# Patient Record
Sex: Female | Born: 1990 | Race: White | Hispanic: No | Marital: Single | Smoking: Current some day smoker
Health system: Southern US, Community
[De-identification: ages and names within clinical notes are randomized; demographics above are authoritative.]

## PROBLEM LIST (undated history)

## (undated) DIAGNOSIS — F419 Anxiety disorder, unspecified: Secondary | ICD-10-CM

## (undated) HISTORY — DX: Anxiety disorder, unspecified: F41.9

## (undated) HISTORY — PX: NO PAST SURGERIES: SHX2092

## (undated) HISTORY — PX: MASTECTOMY, RADICAL: SHX710

---

## 2016-05-16 ENCOUNTER — Encounter: Payer: Self-pay | Admitting: Neurology

## 2016-05-16 ENCOUNTER — Ambulatory Visit (INDEPENDENT_AMBULATORY_CARE_PROVIDER_SITE_OTHER): Payer: Managed Care, Other (non HMO) | Admitting: Neurology

## 2016-05-16 VITALS — BP 106/66 | HR 69 | Ht 62.0 in | Wt 137.2 lb

## 2016-05-16 DIAGNOSIS — G40201 Localization-related (focal) (partial) symptomatic epilepsy and epileptic syndromes with complex partial seizures, not intractable, with status epilepticus: Secondary | ICD-10-CM

## 2016-05-16 DIAGNOSIS — G40209 Localization-related (focal) (partial) symptomatic epilepsy and epileptic syndromes with complex partial seizures, not intractable, without status epilepticus: Secondary | ICD-10-CM

## 2016-05-16 NOTE — Patient Instructions (Addendum)
Remember to drink plenty of fluid, eat healthy meals and do not skip any meals. Try to eat protein with a every meal and eat a healthy snack such as fruit or nuts in between meals. Try to keep a regular sleep-wake schedule and try to exercise daily, particularly in the form of walking, 20-30 minutes a day, if you can.   As far as diagnostic testing: MRI brain, routine EEG in the office and then extended eeg at home  You are unable to drive, operate heavy machinery, perform activities at heights or participate in water activities until 6 months seizure free   Our phone number is [phone]. We also have an after hours call service for urgent matters and there is a physician on-call for urgent questions. For any emergencies you know to call 911 or go to the nearest emergency room   Seizure, Adult A seizure is a sudden burst of abnormal electrical activity in the brain. The abnormal activity temporarily interrupts normal brain function, causing a person to experience any of the following:  Involuntary movements.  Changes in awareness or consciousness.  Uncontrollable shaking (convulsions). Seizures usually last from 30 seconds to 2 minutes. They usually do not cause permanent brain damage unless they are prolonged. What can cause a seizure to happen? Seizures can happen for many reasons including:  A fever.  Low blood sugar.  A medicine.  An illnesses.  A brain injury. Some people who have a seizure never have another one. People who have repeated seizures have a condition called epilepsy. What are the symptoms of a seizure? Symptoms of a seizure vary greatly from person to person. They include:  Convulsions.  Stiffening of the body.  Involuntary movements of the arms or legs.  Loss of consciousness.  Breathing problems.  Falling suddenly.  Confusion.  Head nodding.  Eye blinking or fluttering.  Lip smacking.  Drooling.  Rapid eye movements.  Grunting.  Loss  of bladder control and bowel control.  Staring.  Unresponsiveness. Some people have symptoms right before a seizure happens (aura) and right after a seizure happens. Symptoms of an aura include:  Fear or anxiety.  Nausea.  Feeling like the room is spinning (vertigo).  A feeling of having seen or heard something before (deja vu).  Odd tastes or smells.  Changes in vision, such as seeing flashing lights or spots. Symptoms that may follow a seizure include:  Confusion.  Sleepiness.  Headache.  Weakness of one side of the body. Follow these instructions at home: Medicines  Take over-the-counter and prescription medicines only as told by your health care provider.  Avoid any substances that may prevent your medicine from working properly, such as alcohol. Activity  Do not drive, swim, or do any other activities that would be dangerous if you had another seizure. Wait until your health care provider approves.  If you live in the [place] check with your local DMV (department of motor vehicles) to find out about the local driving laws. Each state has specific rules about when you can legally return to driving.  Get enough rest. Lack of sleep can make seizures more likely to occur. Educating others Teach friends and family what to do if you have a seizure. They should:  Lay you on the ground to prevent a fall.  Cushion your head and body.  Loosen any tight clothing around your neck.  Turn you on your side. If vomiting occurs, this helps keep your airway clear.  Stay with you until  you recover.  Not hold you down. Holding you down will not stop the seizure.  Not put anything in your mouth.  Know whether or not you need emergency care. General instructions  Contact your health care provider each time you have a seizure.  Avoid anything that has ever triggered a seizure for you.  Keep a seizure diary. Record what you remember about each seizure, especially anything  that might have triggered the seizure.  Keep all follow-up visits as told by your health care provider. This is important. Contact a health care provider if:  You have another seizure.  You have seizures more often.  Your seizure symptoms change.  You continue to have seizures with treatment.  You have symptoms of an infection or illness. They might increase your risk of having a seizure. Get help right away if:  You have a seizure:  That lasts longer than 5 minutes.  That is different than previous seizures.  That leaves you unable to speak or use a part of your body.  That makes it harder to breathe.  After a head injury.  You have:  Multiple seizures in a row.  Confusion or a severe headache right after a seizure.  You are having seizures more often.  You do not wake up immediately after a seizure.  You injure yourself during a seizure. These symptoms may represent a serious problem that is an emergency. Do not wait to see if the symptoms will go away. Get medical help right away. Call your local emergency services (911 in the [place] Do not drive yourself to the hospital.  This information is not intended to replace advice given to you by your health care provider. Make sure you discuss any questions you have with your health care provider. Document Released: 04/26/2000 Document Revised: 12/24/2015 Document Reviewed: 12/01/2015 Elsevier Interactive Patient Education  2017 [employer].

## 2016-05-16 NOTE — Progress Notes (Signed)
[facility]    Provider:  Dr Lucia Gaskins Referring Provider: Loren Racer, MD Primary Care Physician:  Loren Racer, MD  CC:  Seizures  HPI:  Lisa Turner is a 26 y.o. female here as a referral from Dr. Graciella Belton for seizures. Patient identifies as Lisa Turner. Past medical history of anxiety. On December 25th at 230am his sisters sais they sounded like he was snoring, legs started spasming, they ran and the parents sat him up and he was not waking up, he was performing non-purposeful actions such as chewing on mother's sleeve. It lasted for 15-20 minutes and the next thing he remembers was being at the front door to put his shoes on to go to the ED. His mentation improved in the car but he was still slightly confused, couldn;t count to 10 but this cleared by the time they were at the ED. He was taking his roommate's Aderrall. Otherwise no new medications or previous illnesses or head trauma. He was taking smaller doses of testosterone and recently increased. No FHx of seizures. No history of seizures.  It was Christmas time and he was stressed and maybe working on less sleep but this is the only triggering factor he can think of. No other current focal neurologic deficits, no other associated symptoms, modifiable factors or inciting events.  Reviewed notes, labs and imaging from outside physicians, which showed:   Review notes. Patient presented to the emergency room on December 25 with seizure-like activity. The father's stepdad observed him to be laying on his side "stiff and shaking". The episode occurred around 2:30 AM. Stepfather observed "sonorous-type respirations" following the episode. Patient does not remember this event. Following the convulsions the patient was somnolent for a period of approximately 10-20 minutes followed by 10-20 minute episode of confusion and disorientation. The patient was attempting to eat his mother's letter with reaching at things that were  not there. While traveling to the emergency room his mentation improved. Patient reported a slight headache at the time of the emergency room arrival, stated it was resolved. Did not bite tongue or urination on himself. Family reported observing flatus during the episode. Vitals were normal in the emergency room, exam including neurologic exam was normal, CBC was abnormal due to elevated white blood cells 12.9, chemistry was unremarkable except for glucose 108. BUN was 12, creatinine 0.82 on 05/06/2016. EKG showed sinus rhythm at a rate of 85, no ST elevations or depressions. CT of the head was negative. CPK was slightly elevated. Toxicology screen positive for amphetamine. Patient takes his roommates Adderall at times. Patient was advised not to drive.   Review of Systems: Patient complains of symptoms per HPI as well as the following symptoms: no CP, no SOB. Pertinent negatives per HPI. All others negative.   Social History   Social History  . Marital status: Single    Spouse name: N/A  . Number of children: 0  . Years of education: BA, MA in progress   Occupational History  . [employer]    Social History Main Topics  . Smoking status: Former Games developer  . Smokeless tobacco: Never Used  . Alcohol use Yes     Comment: Few beers per week  . Drug use:     Types: Marijuana  . Sexual activity: Not on file   Other Topics Concern  . Not on file   Social History Narrative   Lives w/ roommate   Caffeine use: 300-500mg /day    Family History  Problem Relation Age of Onset  . Seizures Neg Hx     Past Medical History:  Diagnosis Date  . Anxiety     Past Surgical History:  Procedure Laterality Date  . NO PAST SURGERIES      Current Outpatient Prescriptions  Medication Sig Dispense Refill  . amphetamine-dextroamphetamine (ADDERALL) 10 MG tablet Take 10 mg by mouth daily with breakfast. 5-10 mg every other day    . FLUOXETINE HCL PO Take 30 mg by mouth daily.    Marland Kitchen testosterone  enanthate (DELATESTRYL) 200 MG/ML injection Inject 60 mg into the muscle every 7 (seven) weeks. For IM use only     No current facility-administered medications for this visit.     Allergies as of 05/16/2016 - Review Complete 05/16/2016  Allergen Reaction Noted  . Bee venom  05/16/2016  . Lactose intolerance (gi)  05/16/2016    Vitals: BP 106/66 (BP Location: Right Arm, Patient Position: Sitting, Cuff Size: Normal)   Pulse 69   Ht 5\' 2"  (1.575 m)   Wt 137 lb 3.2 oz (62.2 kg)   BMI 25.09 kg/m  Last Weight:  Wt Readings from Last 1 Encounters:  05/16/16 137 lb 3.2 oz (62.2 kg)   Last Height:   Ht Readings from Last 1 Encounters:  05/16/16 5\' 2"  (1.575 m)   Physical exam: Exam: Gen: NAD, conversant, well nourised, well groomed                     CV: RRR, no MRG. No Carotid Bruits. No peripheral edema, warm, nontender Eyes: Conjunctivae clear without exudates or hemorrhage  Neuro: Detailed Neurologic Exam  Speech:    Speech is normal; fluent and spontaneous with normal comprehension.  Cognition:    The patient is oriented to person, place, and time;     recent and remote memory intact;     language fluent;     normal attention, concentration,     fund of knowledge Cranial Nerves:    The pupils are equal, round, and reactive to light. The fundi are normal and spontaneous venous pulsations are present. Visual fields are full to finger confrontation. Extraocular movements are intact. Trigeminal sensation is intact and the muscles of mastication are normal. The face is symmetric. The palate elevates in the midline. Hearing intact. Voice is normal. Shoulder shrug is normal. The tongue has normal motion without fasciculations.   Coordination:    Normal finger to nose and heel to shin. Normal rapid alternating movements.   Gait:    Heel-toe and tandem gait are normal.   Motor Observation:    No asymmetry, no atrophy, and no involuntary movements noted. Tone:    Normal  muscle tone.    Posture:    Posture is normal. normal erect    Strength:    Strength is V/V in the upper and lower limbs.      Sensation: intact to LT     Reflex Exam:  DTR's:    Deep tendon reflexes in the upper and lower extremities are normal bilaterally.   Toes:    The toes are downgoing bilaterally.   Clonus:    Clonus is absent.       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  MRI brain w/wo contrast to evaluate for any intracranial focus of seizure such as mass or lesion Routine EEG, if negative consider prolonged ambulatory eeg Discussed that we will not start anti-epilepsy medications at this  time. If the patient has a second seizure he is to call me immediately to initiate treatment. Call 911 for any other seizure activity and also call this office. Patient is unable to drive, operate heavy machinery, perform activities at heights or participate in water activities until 6 months seizure free. Discussed with patient, he acknowledges these restrictions until 6 months seizure free in the state of [place]. Discussed seizure precautions.  Cc: Loren RacerYager, Cornell K, MD  Naomie DeanAntonia Beverley Sherrard, MD  [facility] [address] [place], [place] [id number]  Phone [phone] Fax [phone]

## 2016-06-19 ENCOUNTER — Ambulatory Visit (INDEPENDENT_AMBULATORY_CARE_PROVIDER_SITE_OTHER): Payer: Managed Care, Other (non HMO) | Admitting: Neurology

## 2016-06-19 DIAGNOSIS — G40209 Localization-related (focal) (partial) symptomatic epilepsy and epileptic syndromes with complex partial seizures, not intractable, without status epilepticus: Secondary | ICD-10-CM

## 2016-06-19 DIAGNOSIS — G40201 Localization-related (focal) (partial) symptomatic epilepsy and epileptic syndromes with complex partial seizures, not intractable, with status epilepticus: Secondary | ICD-10-CM | POA: Diagnosis not present

## 2016-06-19 NOTE — Procedures (Signed)
    History:  Lisa Turner is a 26 year old gentleman with a history of anxiety and possible seizures. The patient had an episode of leg spasms, the family was unable to arouse him completely. The episode lasted 15-20 minutes. This occurred on 05/06/2016, coming out of sleep. The patient is being evaluated for possible seizures.  This is a routine EEG. No skull defects are noted. Medications include Adderall, Prozac, and testosterone.   EEG classification: Normal awake  Description of the recording: The background rhythms of this recording consists of a fairly well modulated medium amplitude alpha rhythm of 10 Hz that is reactive to eye opening and closure. As the record progresses, the patient appears to remain in the waking state throughout the recording. Photic stimulation was performed, resulting in a bilateral and symmetric photic driving response. Hyperventilation was also performed, resulting in a minimal buildup of the background rhythm activities without significant slowing seen. At no time during the recording does there appear to be evidence of spike or spike wave discharges or evidence of focal slowing. EKG monitor shows no evidence of cardiac rhythm abnormalities with a heart rate of 60.  Impression: This is a normal EEG recording in the waking state. No evidence of ictal or interictal discharges are seen.

## 2016-06-20 ENCOUNTER — Telehealth: Payer: Self-pay

## 2016-06-20 NOTE — Telephone Encounter (Signed)
-----   Message from Anson FretAntonia B Ahern, MD sent at 06/19/2016  8:44 PM EST ----- EEG was normal. We discussed 3-day ambulatory EEG would you inquire if she is still willing to do this? If so, please fill out a neurovative diagnostics form for me please. Thank you!

## 2016-06-20 NOTE — Telephone Encounter (Signed)
Called w/ normal EEG results. Verbalized understanding and appreciation for call. Is interested in doing 3-day ambulatory EEG but asks if this could wait until after graduation in May.

## 2016-06-23 NOTE — Telephone Encounter (Signed)
I would ask patient to call us when he is ready to proceed and we can fill out the form and get it set up thanks

## 2016-06-24 NOTE — Telephone Encounter (Signed)
Called pt and left VM mssg asking pt to call back when ready to schedule procedure.

## 2016-09-04 ENCOUNTER — Ambulatory Visit: Payer: Managed Care, Other (non HMO) | Admitting: Adult Health

## 2016-09-04 ENCOUNTER — Telehealth: Payer: Self-pay

## 2016-09-04 NOTE — Telephone Encounter (Signed)
Patient did not show to appt today  

## 2016-09-05 ENCOUNTER — Encounter: Payer: Self-pay | Admitting: Adult Health

## 2017-08-06 ENCOUNTER — Encounter: Payer: Self-pay | Admitting: Emergency Medicine

## 2017-08-06 ENCOUNTER — Ambulatory Visit: Payer: Self-pay | Admitting: Emergency Medicine

## 2017-08-06 VITALS — BP 110/72 | HR 90 | Temp 98.5°F | Wt 137.6 lb

## 2017-08-06 DIAGNOSIS — Z760 Encounter for issue of repeat prescription: Secondary | ICD-10-CM

## 2017-08-06 MED ORDER — FLUOXETINE HCL 20 MG PO CAPS: 20.0000 mg | ORAL_CAPSULE | Freq: Every day | ORAL | 1 refills | Status: AC

## 2017-08-06 NOTE — Progress Notes (Signed)
S: Lisa Turner  Is a 27 y.o. female ,transitioning to female, presenting for medication refill. He is requesting Prozac 20 mg. Reports he has been on this medication long term and is stabilized on it. He takes it for anxiety. He is currently in between providers and has an appointment scheduled in late April. Discussed anxiety and depression symptoms with patient' patient denied current symptoms of either condition and reports to be feeling well.  Review of Systems  Constitutional: Negative.   Neurological: Negative.   Psychiatric/Behavioral: Negative.    O: Vitals:   08/06/17 1502  BP: 110/72  Pulse: 90  Temp: 98.5 F (36.9 C)  SpO2: 99%   Physical Exam  Constitutional: She appears well-developed and well-nourished. No distress.  Neurological: She is alert.  Skin: Skin is warm and dry.  Psychiatric: Her speech is normal and behavior is normal. Judgment and thought content normal. Her mood appears not anxious. She is not actively hallucinating. She does not exhibit a depressed mood. She expresses no homicidal and no suicidal ideation. She is attentive.  Nursing note and vitals reviewed.  A: Medication Refill for anxiety  P: provided refill on patient's prozac, encouraged to keep appointment with new PCP, ER for any symptoms of depression or anxiety. Return as needed.

## 2017-08-06 NOTE — Patient Instructions (Signed)
Medicine Refill at the Emergency Department We have refilled your medicine today, but it is best for you to get refills through your primary health care provider's office. In the future, please plan ahead so you do not need to get refills from the emergency department. If the medicine we refilled was a maintenance medicine, you may have received only enough to get you by until you are able to see your regular health care provider. This information is not intended to replace advice given to you by your health care provider. Make sure you discuss any questions you have with your health care provider. Document Released: 08/16/2003 Document Revised: 04/12/2016 Document Reviewed: 08/06/2013 Elsevier Interactive Patient Education  2018 [employer]vier Inc[employer]  

## 2020-01-18 ENCOUNTER — Encounter (HOSPITAL_COMMUNITY): Payer: Self-pay | Admitting: *Deleted

## 2020-01-18 ENCOUNTER — Other Ambulatory Visit: Payer: Self-pay

## 2020-01-18 ENCOUNTER — Emergency Department (HOSPITAL_COMMUNITY)
Admission: EM | Admit: 2020-01-18 | Discharge: 2020-01-19 | Disposition: A | Discharge status: Home / Self Care | Payer: BLUE CROSS/BLUE SHIELD | Attending: Emergency Medicine | Admitting: Emergency Medicine

## 2020-01-18 ENCOUNTER — Emergency Department (HOSPITAL_COMMUNITY): Payer: BLUE CROSS/BLUE SHIELD

## 2020-01-18 DIAGNOSIS — R0789 Other chest pain: Secondary | ICD-10-CM

## 2020-01-18 DIAGNOSIS — R079 Chest pain, unspecified: Secondary | ICD-10-CM | POA: Insufficient documentation

## 2020-01-18 DIAGNOSIS — F17210 Nicotine dependence, cigarettes, uncomplicated: Secondary | ICD-10-CM | POA: Insufficient documentation

## 2020-01-18 DIAGNOSIS — Z79899 Other long term (current) drug therapy: Secondary | ICD-10-CM | POA: Insufficient documentation

## 2020-01-18 LAB — I-STAT BETA HCG BLOOD, ED (NOT ORDERABLE): I-stat hCG, quantitative: <5 m[IU]/mL (ref <5)

## 2020-01-18 LAB — BASIC METABOLIC PANEL
Anion gap: 9 (ref 5–15)
BUN: 13 mg/dL (ref 6–20)
CO2: 25 mmol/L (ref 22–32)
Calcium: 9.6 mg/dL (ref 8.9–10.3)
Chloride: 104 mmol/L (ref 98–111)
Creatinine, Ser: 0.76 mg/dL (ref 0.44–1.00)
GFR calc Af Amer: >60 mL/min (ref >60)
GFR calc non Af Amer: >60 mL/min (ref >60)
Glucose, Bld: 99 mg/dL (ref 70–99)
Potassium: 4.3 mmol/L (ref 3.5–5.1)
Sodium: 138 mmol/L (ref 135–145)

## 2020-01-18 LAB — CBC
HCT: 46.9 % — ABNORMAL HIGH (ref 36.0–46.0)
Hemoglobin: 16 g/dL — ABNORMAL HIGH (ref 12.0–15.0)
MCH: 31.4 pg (ref 26.0–34.0)
MCHC: 34.1 g/dL (ref 30.0–36.0)
MCV: 92 fL (ref 80.0–100.0)
Platelets: 272 10*3/uL (ref 150–400)
RBC: 5.1 MIL/uL (ref 3.87–5.11)
RDW: 12.2 % (ref 11.5–15.5)
WBC: 5.6 10*3/uL (ref 4.0–10.5)
nRBC: 0 % (ref 0.0–0.2)

## 2020-01-18 LAB — TROPONIN I (HIGH SENSITIVITY): Troponin I (High Sensitivity): <2 ng/L (ref <18)

## 2020-01-18 NOTE — ED Triage Notes (Signed)
Patient has had intermittent chest pain around the left side of the chest for the past 2 weeks.  No fevers.  No cough.  No sob.  Patient was seen at [facility] today and they advised he come for EKG today.  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  He reports that his pain is not related to activity.  Comes and goes and pressure and dull pain.  Occasionally a sharp pain is noted.

## 2020-01-19 LAB — TROPONIN I (HIGH SENSITIVITY): Troponin I (High Sensitivity): 2 ng/L (ref <18)

## 2020-01-19 LAB — D-DIMER, QUANTITATIVE: D-Dimer, Quant: 0.28 ug/mL-FEU (ref 0.00–0.50)

## 2020-01-19 NOTE — ED Provider Notes (Signed)
[facility]-EMERGENCY DEPT Provider Note   CSN: [id number] Arrival date & time: 01/18/20  1143     History Chief Complaint  Patient presents with   Chest Pain    intermittent for a couple of weeks    ANEDRA PENAFIEL is a 29 y.o. adult.  Patient presents to the emergency department for evaluation of chest pain.  Patient reports intermittent episodes of left-sided chest pain over the last couple of weeks.  Pain usually comes on and lasts for an hour or 2 and then resolves.  He has not identified any factors that cause the pain or make it go away.  No associated shortness of breath, nausea or diaphoresis.  Pain not related to exertion, movement or position.        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     Family History  Problem Relation Age of Onset   Seizures Neg Hx     Social History   Tobacco Use   Smoking status: Current Some Day Smoker    Types: Cigarettes   Smokeless tobacco: Never Used  Substance Use Topics   Alcohol use: Yes    Comment: Few beers per week   Drug use: Yes    Types: Marijuana    Home Medications Prior to Admission medications   Medication Sig Start Date End Date Taking? Authorizing Provider  amphetamine-dextroamphetamine (ADDERALL) 10 MG tablet Take 10 mg by mouth daily with breakfast. 5-10 mg every other day    [provider]  FLUoxetine (PROZAC) 20 MG capsule Take 1 capsule (20 mg total) by mouth daily. 08/06/17   Dorena Bodo, NP  testosterone enanthate (DELATESTRYL) 200 MG/ML injection Inject 60 mg into the muscle every 7 (seven) weeks. For IM use only    [provider]    Allergies    Bee venom and Lactose intolerance (gi)  Review of Systems   Review of Systems  Cardiovascular: Positive for chest pain.  All other systems reviewed and are negative.   Physical Exam Updated Vital Signs BP 120/76 (BP Location: Left Arm)    Pulse 79    Temp 98 F (36.7 C) (Oral)    Resp 16    Ht 5\' 2"  (1.575 m)    Wt 65.8 kg    SpO2 100%    BMI 26.52 kg/m   Physical Exam Vitals and nursing note reviewed.  Constitutional:      General: He is not in acute distress.    Appearance: Normal appearance. He is well-developed.  HENT:     Head: Normocephalic and atraumatic.     Right Ear: Hearing normal.     Left Ear: Hearing normal.     Nose: Nose normal.  Eyes:     Conjunctiva/sclera: Conjunctivae normal.     Pupils: Pupils are equal, round, and reactive to light.  Cardiovascular:     Rate and Rhythm: Regular rhythm.     Heart sounds: S1 normal and S2 normal. No murmur heard.  No friction rub. No gallop.   Pulmonary:     Effort: Pulmonary  effort is normal. No respiratory distress.     Breath sounds: Normal breath sounds.  Chest:     Chest wall: No tenderness.  Abdominal:     General: Bowel sounds are normal.     Palpations: Abdomen is soft.     Tenderness: There is no abdominal tenderness. There is no guarding or rebound. Negative signs include Murphy's sign and McBurney's sign.     Hernia: No hernia is present.  Musculoskeletal:        General: Normal range of motion.     Cervical back: Normal range of motion and neck supple.  Skin:    General: Skin is warm and dry.     Findings: No rash.  Neurological:     Mental Status: He is alert and oriented to person, place, and time.     GCS: GCS eye subscore is 4. GCS verbal subscore is 5. GCS motor subscore is 6.     Cranial Nerves: No cranial nerve deficit.     Sensory: No sensory deficit.     Coordination: Coordination normal.  Psychiatric:        Speech: Speech normal.        Behavior:  Behavior normal.        Thought Content: Thought content normal.     ED Results / Procedures / Treatments   Labs (all labs ordered are listed, but only abnormal results are displayed) Labs Reviewed  CBC - Abnormal; Notable for the following components:      Result Value   Hemoglobin 16.0 (*)    HCT 46.9 (*)    All other components within normal limits  BASIC METABOLIC PANEL  D-DIMER, QUANTITATIVE (NOT AT [facility])  I-STAT BETA HCG BLOOD, ED ([facility], [facility], [facility] ONLY)  I-STAT BETA HCG BLOOD, ED (NOT ORDERABLE)  TROPONIN I (HIGH SENSITIVITY)  TROPONIN I (HIGH SENSITIVITY)    EKG EKG Interpretation  Date/Time:  Tuesday January 18 2020 11:55:09 EDT Ventricular Rate:  80 PR Interval:    QRS Duration: 79 QT Interval:  332 QTC Calculation: 383 R Axis:   86 Text Interpretation: Sinus arrhythmia 12 Lead; Mason-Likar Confirmed by Gilda Crease [id number]) on 01/19/2020 1:30:14 AM   Radiology DG Chest 2 View  Result Date: 01/18/2020 CLINICAL DATA:  Chest pain. EXAM: CHEST - 2 VIEW COMPARISON:  None. FINDINGS: The heart size and mediastinal contours are within normal limits. Both lungs are clear. No pneumothorax or pleural effusion is noted. The visualized skeletal structures are unremarkable. IMPRESSION: No active cardiopulmonary disease. Electronically Signed   By: Lupita Raider M.D.   On: 01/18/2020 12:20    Procedures Procedures (including critical care time)  Medications Ordered in ED Medications - No data to display  ED Course  I have reviewed the triage vital signs and the nursing notes.  Pertinent labs & imaging results that were available during my care of the patient were reviewed by me and considered in my medical decision making (see chart for details).    MDM Rules/Calculators/A&P                          Patient presents to the emergency department for evaluation of chest pain.  Symptoms have been ongoing for approximately 2 weeks.  He reports intermittent pain that  sometimes last 1 or 2 hours and then resolves.  He has not identified any alleviating or exacerbating factors.  Patient does not have any cardiac risk factors.  EKG  unremarkable, troponins negative.  Patient also does not have any PE risk factors.  He is PERC negative, Wells negative and D-dimer was normal.  Patient reassured, no further work-up necessary.  Final Clinical Impression(s) / ED Diagnoses Final diagnoses:  Atypical chest pain    Rx / DC Orders ED Discharge Orders    None       Rosealie Reach, Canary Brim, MD 01/19/20 [phone]

## 2020-07-27 ENCOUNTER — Ambulatory Visit: Payer: Self-pay | Admitting: Nurse Practitioner

## 2022-03-03 IMAGING — CR DG CHEST 2V
2 series · 2 of 2 positions shown · non-contrast
Comparison: None.

CLINICAL DATA: Chest pain.

EXAM:
CHEST - 2 VIEW

[w chest pa]
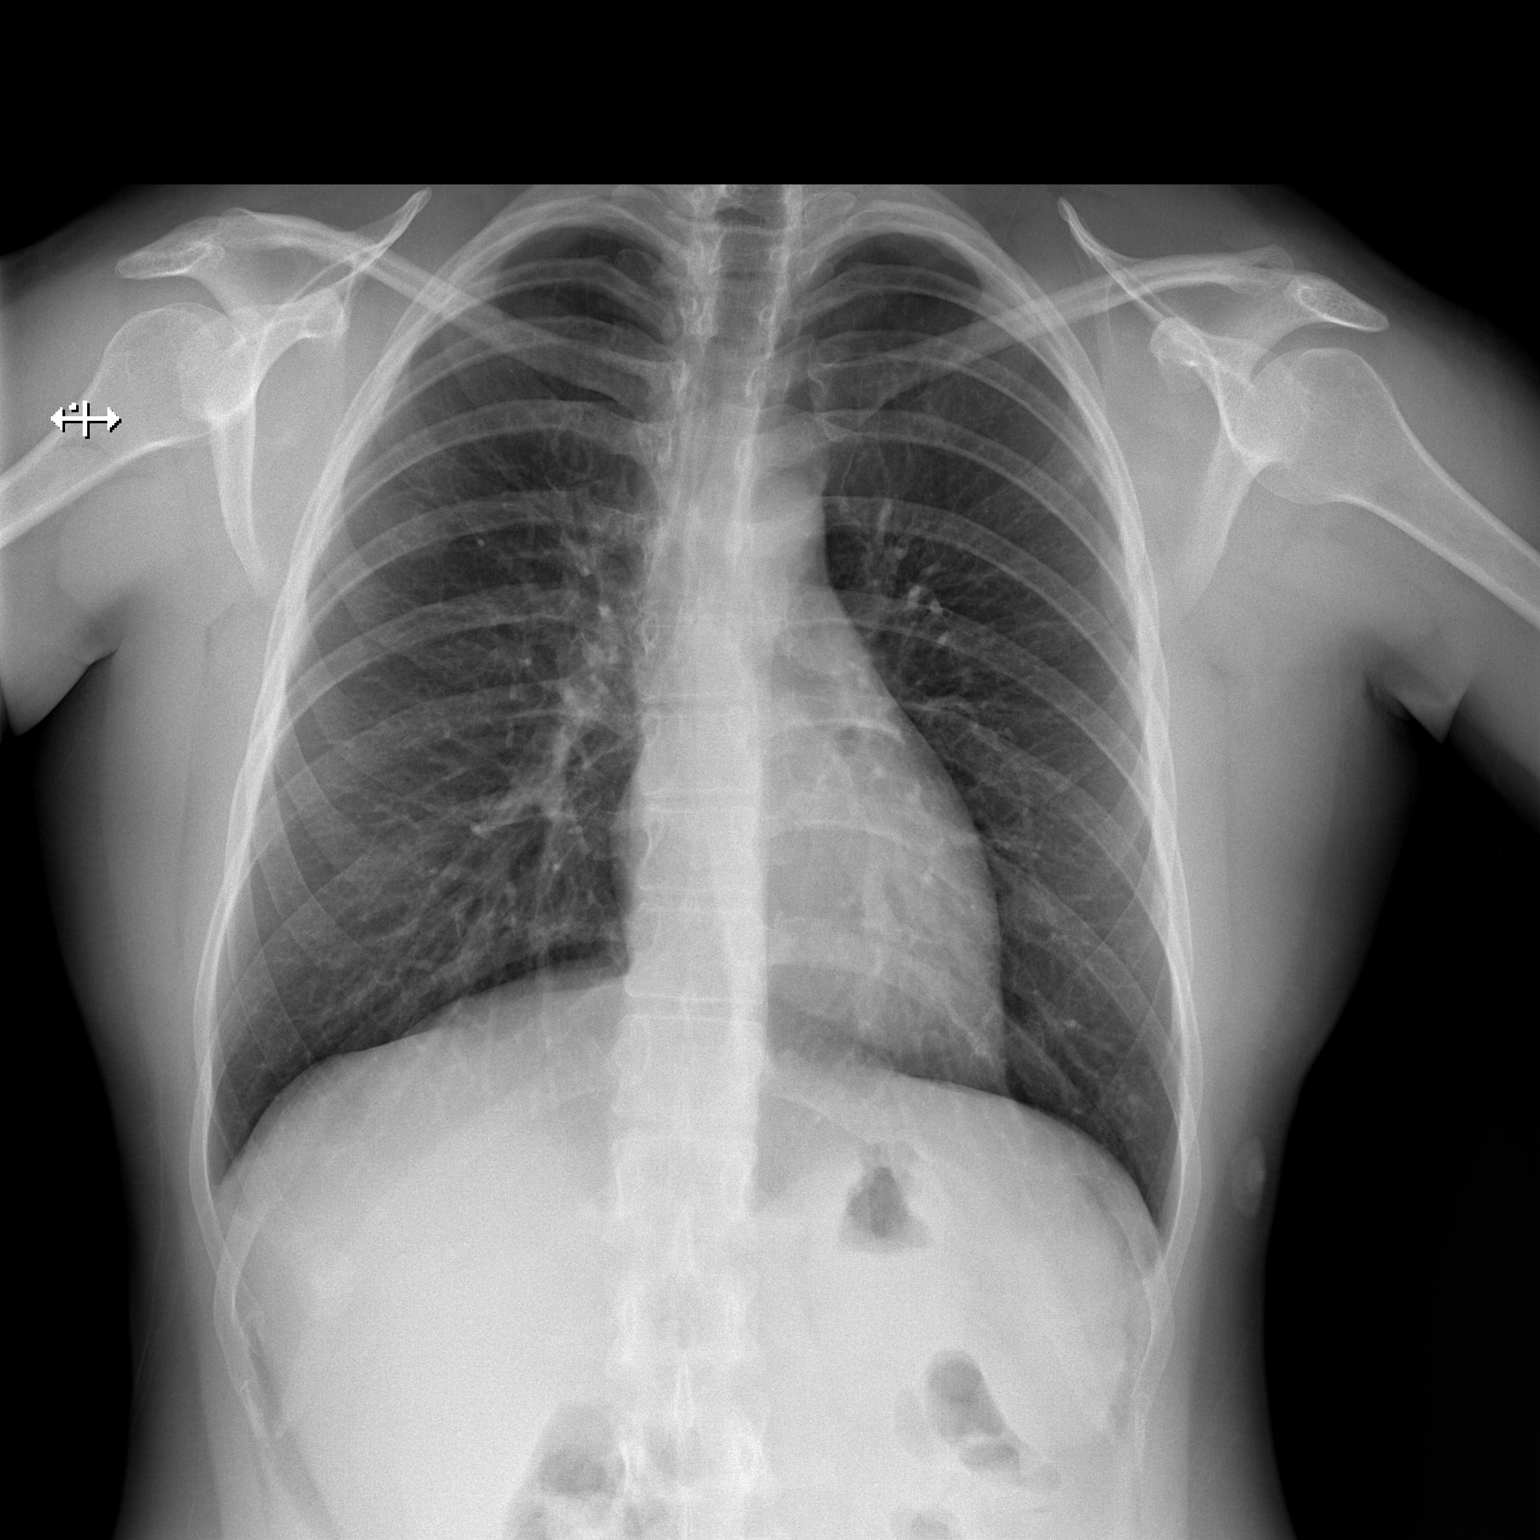

[w chest lat]
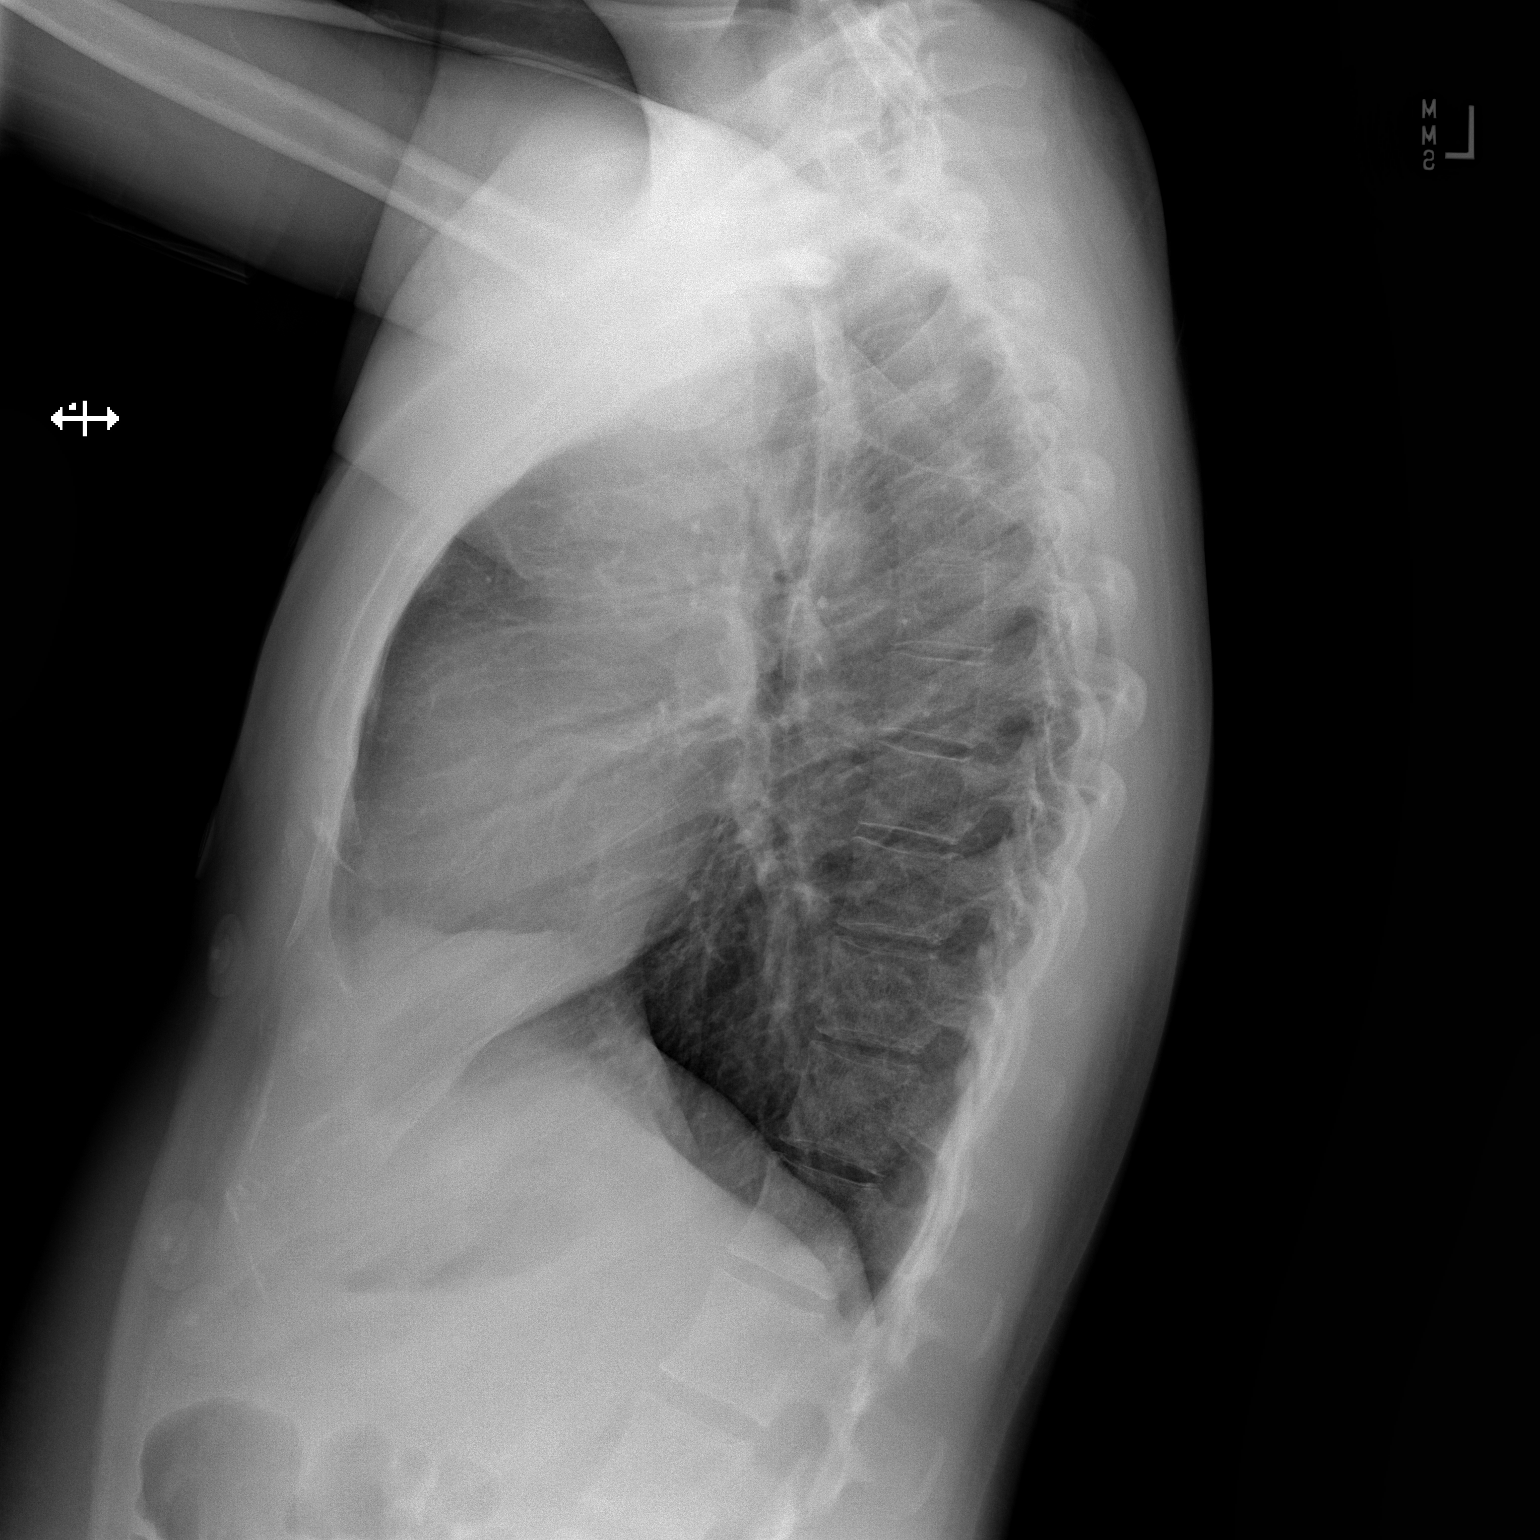

[2 of 2 positions shown; findings below may reference images not displayed]

FINDINGS: The heart size and mediastinal contours are within normal limits.
Both lungs are clear. No pneumothorax or pleural effusion is noted.
The visualized skeletal structures are unremarkable.
IMPRESSION: No active cardiopulmonary disease.
